# Patient Record
Sex: Female | Born: 1995 | Race: Black or African American | Hispanic: No | Marital: Single | Smoking: Never smoker
Health system: Southern US, Community
[De-identification: ages and names within clinical notes are randomized; demographics above are authoritative.]

## PROBLEM LIST (undated history)

## (undated) DIAGNOSIS — E860 Dehydration: Secondary | ICD-10-CM

## (undated) DIAGNOSIS — R1115 Cyclical vomiting syndrome unrelated to migraine: Secondary | ICD-10-CM

## (undated) DIAGNOSIS — K219 Gastro-esophageal reflux disease without esophagitis: Secondary | ICD-10-CM

## (undated) HISTORY — PX: NO PAST SURGERIES: SHX2092

---
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
## 2017-07-03 ENCOUNTER — Emergency Department (HOSPITAL_COMMUNITY): Payer: Medicaid Other

## 2017-07-03 ENCOUNTER — Inpatient Hospital Stay (HOSPITAL_COMMUNITY)
Admission: EM | Admit: 2017-07-03 | Discharge: 2017-07-05 | DRG: 208 | Disposition: A | Discharge status: Home / Self Care | Payer: Medicaid Other | Attending: General Surgery | Admitting: General Surgery

## 2017-07-03 DIAGNOSIS — J9600 Acute respiratory failure, unspecified whether with hypoxia or hypercapnia: Principal | ICD-10-CM | POA: Diagnosis present

## 2017-07-03 DIAGNOSIS — R402112 Coma scale, eyes open, never, at arrival to emergency department: Secondary | ICD-10-CM | POA: Diagnosis present

## 2017-07-03 DIAGNOSIS — F1290 Cannabis use, unspecified, uncomplicated: Secondary | ICD-10-CM | POA: Diagnosis present

## 2017-07-03 DIAGNOSIS — R402332 Coma scale, best motor response, abnormal, at arrival to emergency department: Secondary | ICD-10-CM | POA: Diagnosis present

## 2017-07-03 DIAGNOSIS — S060X0A Concussion without loss of consciousness, initial encounter: Secondary | ICD-10-CM | POA: Diagnosis present

## 2017-07-03 DIAGNOSIS — J9690 Respiratory failure, unspecified, unspecified whether with hypoxia or hypercapnia: Secondary | ICD-10-CM

## 2017-07-03 DIAGNOSIS — S81011A Laceration without foreign body, right knee, initial encounter: Secondary | ICD-10-CM | POA: Diagnosis present

## 2017-07-03 DIAGNOSIS — K219 Gastro-esophageal reflux disease without esophagitis: Secondary | ICD-10-CM | POA: Diagnosis present

## 2017-07-03 DIAGNOSIS — N179 Acute kidney failure, unspecified: Secondary | ICD-10-CM | POA: Diagnosis present

## 2017-07-03 DIAGNOSIS — R402212 Coma scale, best verbal response, none, at arrival to emergency department: Secondary | ICD-10-CM | POA: Diagnosis present

## 2017-07-03 DIAGNOSIS — S40812A Abrasion of left upper arm, initial encounter: Secondary | ICD-10-CM | POA: Diagnosis present

## 2017-07-03 DIAGNOSIS — Y92410 Unspecified street and highway as the place of occurrence of the external cause: Secondary | ICD-10-CM

## 2017-07-03 HISTORY — DX: Gastro-esophageal reflux disease without esophagitis: K21.9

## 2017-07-03 LAB — URINALYSIS, ROUTINE W REFLEX MICROSCOPIC
Bilirubin Urine: NEGATIVE
GLUCOSE, UA: NEGATIVE mg/dL
HGB URINE DIPSTICK: NEGATIVE
Ketones, ur: NEGATIVE mg/dL
Leukocytes, UA: NEGATIVE
Nitrite: NEGATIVE
PH: 5 (ref 5.0–8.0)
Protein, ur: NEGATIVE mg/dL

## 2017-07-03 LAB — I-STAT CHEM 8, ED
BUN: 7 mg/dL (ref 6–20)
Calcium, Ion: 1.13 mmol/L — ABNORMAL LOW (ref 1.15–1.40)
Chloride: 105 mmol/L (ref 101–111)
Creatinine, Ser: 1.1 mg/dL — ABNORMAL HIGH (ref 0.44–1.00)
Glucose, Bld: 117 mg/dL — ABNORMAL HIGH (ref 65–99)
HEMATOCRIT: 41 % (ref 36.0–46.0)
Hemoglobin: 13.9 g/dL (ref 12.0–15.0)
Potassium: 3.6 mmol/L (ref 3.5–5.1)
SODIUM: 140 mmol/L (ref 135–145)
TCO2: 23 mmol/L (ref 22–32)

## 2017-07-03 LAB — CBC
HEMATOCRIT: 37.4 % (ref 36.0–46.0)
Hemoglobin: 11.9 g/dL — ABNORMAL LOW (ref 12.0–15.0)
MCH: 27.2 pg (ref 26.0–34.0)
MCHC: 31.8 g/dL (ref 30.0–36.0)
MCV: 85.4 fL (ref 78.0–100.0)
Platelets: 213 10*3/uL (ref 150–400)
RBC: 4.38 MIL/uL (ref 3.87–5.11)
RDW: 15 % (ref 11.5–15.5)
WBC: 17.3 10*3/uL — ABNORMAL HIGH (ref 4.0–10.5)

## 2017-07-03 LAB — COMPREHENSIVE METABOLIC PANEL
ALBUMIN: 4.1 g/dL (ref 3.5–5.0)
ALT: 29 U/L (ref 14–54)
ANION GAP: 11 (ref 5–15)
AST: 58 U/L — AB (ref 15–41)
Alkaline Phosphatase: 69 U/L (ref 38–126)
BUN: 6 mg/dL (ref 6–20)
CO2: 22 mmol/L (ref 22–32)
Calcium: 8.9 mg/dL (ref 8.9–10.3)
Chloride: 106 mmol/L (ref 101–111)
Creatinine, Ser: 1.16 mg/dL — ABNORMAL HIGH (ref 0.44–1.00)
GFR calc Af Amer: >60 mL/min (ref >60)
GFR calc non Af Amer: >60 mL/min (ref >60)
GLUCOSE: 115 mg/dL — AB (ref 65–99)
POTASSIUM: 3.8 mmol/L (ref 3.5–5.1)
SODIUM: 139 mmol/L (ref 135–145)
Total Bilirubin: 1.2 mg/dL (ref 0.3–1.2)
Total Protein: 7 g/dL (ref 6.5–8.1)

## 2017-07-03 LAB — ABO/RH: ABO/RH(D): O POS

## 2017-07-03 LAB — PREPARE FRESH FROZEN PLASMA
UNIT DIVISION: 0
Unit division: 0

## 2017-07-03 LAB — BPAM FFP
BLOOD PRODUCT EXPIRATION DATE: 201905102359
Blood Product Expiration Date: 201905102359
ISSUE DATE / TIME: 201905091812
ISSUE DATE / TIME: 201905091812
Unit Type and Rh: 6200
Unit Type and Rh: 6200

## 2017-07-03 LAB — RAPID URINE DRUG SCREEN, HOSP PERFORMED
Amphetamines: NOT DETECTED
BARBITURATES: NOT DETECTED
BENZODIAZEPINES: POSITIVE — AB
COCAINE: NOT DETECTED
Opiates: NOT DETECTED
TETRAHYDROCANNABINOL: POSITIVE — AB

## 2017-07-03 LAB — PROTIME-INR
INR: 1.08
Prothrombin Time: 14 seconds (ref 11.4–15.2)

## 2017-07-03 LAB — ETHANOL: Alcohol, Ethyl (B): <10 mg/dL (ref <10)

## 2017-07-03 LAB — MAGNESIUM: Magnesium: 2 mg/dL (ref 1.7–2.4)

## 2017-07-03 LAB — CDS SEROLOGY

## 2017-07-03 LAB — CK: CK TOTAL: 254 U/L — AB (ref 38–234)

## 2017-07-03 LAB — PHOSPHORUS: Phosphorus: 3.6 mg/dL (ref 2.5–4.6)

## 2017-07-03 LAB — I-STAT CG4 LACTIC ACID, ED: LACTIC ACID, VENOUS: 2.96 mmol/L — AB (ref 0.5–1.9)

## 2017-07-03 LAB — TRIGLYCERIDES: Triglycerides: 89 mg/dL (ref <150)

## 2017-07-03 MED ORDER — ONDANSETRON 4 MG PO TBDP: 4.0000 mg | ORAL_TABLET | Freq: Four times a day (QID) | ORAL | Status: DC | PRN

## 2017-07-03 MED ORDER — PROPOFOL 1000 MG/100ML IV EMUL
INTRAVENOUS | Status: AC
  Filled 2017-07-03: qty 100

## 2017-07-03 MED ORDER — PROPOFOL 1000 MG/100ML IV EMUL
5.0000 ug/kg/min | INTRAVENOUS | Status: DC
  Administered 2017-07-03: 10 ug/kg/min via INTRAVENOUS

## 2017-07-03 MED ORDER — ONDANSETRON HCL 4 MG/2ML IJ SOLN
4.0000 mg | Freq: Four times a day (QID) | INTRAMUSCULAR | Status: DC | PRN
  Administered 2017-07-04: 4 mg via INTRAVENOUS
  Filled 2017-07-03: qty 2

## 2017-07-03 MED ORDER — KCL IN DEXTROSE-NACL 20-5-0.9 MEQ/L-%-% IV SOLN
INTRAVENOUS | Status: DC
  Administered 2017-07-03 – 2017-07-04 (×3): via INTRAVENOUS
  Filled 2017-07-03 (×4): qty 1000

## 2017-07-03 MED ORDER — ENOXAPARIN SODIUM 40 MG/0.4ML ~~LOC~~ SOLN
40.0000 mg | SUBCUTANEOUS | Status: DC
  Administered 2017-07-04 – 2017-07-05 (×2): 40 mg via SUBCUTANEOUS
  Filled 2017-07-03 (×2): qty 0.4

## 2017-07-03 MED ORDER — HYDROMORPHONE HCL 2 MG/ML IJ SOLN: 1.0000 mg | INTRAMUSCULAR | Status: DC | PRN

## 2017-07-03 MED ORDER — MIDAZOLAM HCL 2 MG/2ML IJ SOLN
INTRAMUSCULAR | Status: AC
  Administered 2017-07-03: 2 mg via INTRAVENOUS
  Filled 2017-07-03: qty 2

## 2017-07-03 MED ORDER — SODIUM CHLORIDE 0.9 % IV SOLN
1.0000 g | Freq: Once | INTRAVENOUS | Status: AC
  Administered 2017-07-03: 1 g via INTRAVENOUS
  Filled 2017-07-03: qty 10

## 2017-07-03 MED ORDER — OXYCODONE HCL 5 MG PO TABS: 5.0000 mg | ORAL_TABLET | ORAL | Status: DC | PRN

## 2017-07-03 MED ORDER — ACETAMINOPHEN 325 MG PO TABS
650.0000 mg | ORAL_TABLET | ORAL | Status: DC | PRN
  Administered 2017-07-05: 650 mg via ORAL
  Filled 2017-07-03: qty 2

## 2017-07-03 MED ORDER — CHLORHEXIDINE GLUCONATE 0.12% ORAL RINSE (MEDLINE KIT)
15.0000 mL | Freq: Two times a day (BID) | OROMUCOSAL | Status: DC
  Administered 2017-07-03 – 2017-07-04 (×2): 15 mL via OROMUCOSAL

## 2017-07-03 MED ORDER — FENTANYL CITRATE (PF) 100 MCG/2ML IJ SOLN: 25.0000 ug | INTRAMUSCULAR | Status: DC | PRN

## 2017-07-03 MED ORDER — NALOXONE HCL 2 MG/2ML IJ SOSY
PREFILLED_SYRINGE | INTRAMUSCULAR | Status: AC | PRN
  Administered 2017-07-03: 2 mg via INTRAVENOUS

## 2017-07-03 MED ORDER — MIDAZOLAM HCL 2 MG/2ML IJ SOLN
2.0000 mg | INTRAMUSCULAR | Status: DC | PRN
  Administered 2017-07-03: 2 mg via INTRAVENOUS

## 2017-07-03 MED ORDER — NALOXONE HCL 2 MG/2ML IJ SOSY
PREFILLED_SYRINGE | INTRAMUSCULAR | Status: AC
  Filled 2017-07-03: qty 2

## 2017-07-03 MED ORDER — LACTATED RINGERS IV BOLUS
1000.0000 mL | Freq: Once | INTRAVENOUS | Status: AC
  Administered 2017-07-03: 1000 mL via INTRAVENOUS

## 2017-07-03 MED ORDER — ORAL CARE MOUTH RINSE
15.0000 mL | OROMUCOSAL | Status: DC
  Administered 2017-07-04 (×5): 15 mL via OROMUCOSAL

## 2017-07-03 MED ORDER — MIDAZOLAM HCL 2 MG/2ML IJ SOLN
2.0000 mg | INTRAMUSCULAR | Status: AC | PRN
  Administered 2017-07-03 (×3): 2 mg via INTRAVENOUS
  Filled 2017-07-03 (×2): qty 2

## 2017-07-03 MED ORDER — FENTANYL CITRATE (PF) 100 MCG/2ML IJ SOLN: 100.0000 ug | INTRAMUSCULAR | Status: DC | PRN

## 2017-07-03 MED ORDER — ROCURONIUM BROMIDE 50 MG/5ML IV SOLN
INTRAVENOUS | Status: AC | PRN
  Administered 2017-07-03: 60 mg via INTRAVENOUS

## 2017-07-03 MED ORDER — ETOMIDATE 2 MG/ML IV SOLN
INTRAVENOUS | Status: AC | PRN
  Administered 2017-07-03: 20 mg via INTRAVENOUS

## 2017-07-03 MED ORDER — FENTANYL CITRATE (PF) 100 MCG/2ML IJ SOLN
50.0000 ug | INTRAMUSCULAR | Status: DC | PRN
  Administered 2017-07-03 – 2017-07-04 (×3): 100 ug via INTRAVENOUS
  Filled 2017-07-03 (×3): qty 2

## 2017-07-03 MED ORDER — SODIUM CHLORIDE 0.9 % IV SOLN
INTRAVENOUS | Status: AC | PRN
  Administered 2017-07-03: 1000 mL via INTRAVENOUS

## 2017-07-03 MED ORDER — PROPOFOL 1000 MG/100ML IV EMUL
5.0000 ug/kg/min | INTRAVENOUS | Status: DC
  Administered 2017-07-03: 60 ug/kg/min via INTRAVENOUS
  Administered 2017-07-04: 30 ug/kg/min via INTRAVENOUS
  Filled 2017-07-03 (×2): qty 100

## 2017-07-03 MED ORDER — IOHEXOL 300 MG/ML  SOLN
100.0000 mL | Freq: Once | INTRAMUSCULAR | Status: AC | PRN
  Administered 2017-07-03: 100 mL via INTRAVENOUS
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
## 2017-07-04 ENCOUNTER — Encounter (HOSPITAL_COMMUNITY): Payer: Self-pay | Admitting: Emergency Medicine

## 2017-07-04 ENCOUNTER — Inpatient Hospital Stay (HOSPITAL_COMMUNITY): Payer: Medicaid Other

## 2017-07-04 ENCOUNTER — Other Ambulatory Visit: Payer: Self-pay

## 2017-07-04 DIAGNOSIS — R4182 Altered mental status, unspecified: Secondary | ICD-10-CM

## 2017-07-04 LAB — COMPREHENSIVE METABOLIC PANEL
ALK PHOS: 69 U/L (ref 38–126)
ALT: 33 U/L (ref 14–54)
AST: 55 U/L — AB (ref 15–41)
Albumin: 3.5 g/dL (ref 3.5–5.0)
Anion gap: 7 (ref 5–15)
BUN: 5 mg/dL — AB (ref 6–20)
CALCIUM: 8.8 mg/dL — AB (ref 8.9–10.3)
CHLORIDE: 105 mmol/L (ref 101–111)
CO2: 26 mmol/L (ref 22–32)
CREATININE: 0.91 mg/dL (ref 0.44–1.00)
Glucose, Bld: 117 mg/dL — ABNORMAL HIGH (ref 65–99)
Potassium: 3.5 mmol/L (ref 3.5–5.1)
SODIUM: 138 mmol/L (ref 135–145)
Total Bilirubin: 1 mg/dL (ref 0.3–1.2)
Total Protein: 6.1 g/dL — ABNORMAL LOW (ref 6.5–8.1)

## 2017-07-04 LAB — TYPE AND SCREEN
ABO/RH(D): O POS
Antibody Screen: NEGATIVE
Unit division: 0
Unit division: 0

## 2017-07-04 LAB — CBC
HEMATOCRIT: 36.1 % (ref 36.0–46.0)
Hemoglobin: 11.4 g/dL — ABNORMAL LOW (ref 12.0–15.0)
MCH: 26.5 pg (ref 26.0–34.0)
MCHC: 31.6 g/dL (ref 30.0–36.0)
MCV: 84 fL (ref 78.0–100.0)
Platelets: 164 10*3/uL (ref 150–400)
RBC: 4.3 MIL/uL (ref 3.87–5.11)
RDW: 15.1 % (ref 11.5–15.5)
WBC: 15.9 10*3/uL — AB (ref 4.0–10.5)

## 2017-07-04 LAB — BPAM RBC
Blood Product Expiration Date: 201906042359
Blood Product Expiration Date: 201906052359
ISSUE DATE / TIME: 201905091811
ISSUE DATE / TIME: 201905091811
UNIT TYPE AND RH: 9500
Unit Type and Rh: 9500

## 2017-07-04 LAB — BLOOD PRODUCT ORDER (VERBAL) VERIFICATION

## 2017-07-04 LAB — HIV ANTIBODY (ROUTINE TESTING W REFLEX): HIV SCREEN 4TH GENERATION: NONREACTIVE

## 2017-07-04 LAB — MRSA PCR SCREENING: MRSA by PCR: NEGATIVE

## 2017-07-04 MED ORDER — IOHEXOL 300 MG/ML  SOLN: 100.0000 mL | Freq: Once | INTRAMUSCULAR | Status: DC | PRN

## 2017-07-04 MED ORDER — MIDAZOLAM HCL 2 MG/2ML IJ SOLN: 2.0000 mg | INTRAMUSCULAR | Status: DC | PRN

## 2017-07-04 MED ORDER — POTASSIUM CHLORIDE 10 MEQ/100ML IV SOLN
10.0000 meq | Freq: Once | INTRAVENOUS | Status: AC
  Administered 2017-07-04: 10 meq via INTRAVENOUS
  Filled 2017-07-04: qty 100
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
## 2017-07-04 NOTE — Procedures (Signed)
Extubation Procedure Note  Patient Details:   Name: Loretta Clark DOB: 10-Jan-1996 MRN: [id number]   Airway Documentation:  Airway 7.5 mm (Active)  Secured at (cm) 23 cm 07/04/2017  8:35 AM  Measured From Lips 07/04/2017  8:35 AM  Secured Location Center 07/04/2017  8:35 AM  Secured By [employer] 07/04/2017  8:35 AM  Tube Holder Repositioned Yes 07/04/2017  8:35 AM  Site Condition Dry 07/04/2017  8:35 AM   Vent end date: (not recorded) Vent end time: (not recorded)   Evaluation  O2 sats: stable throughout Complications: No apparent complications Patient did tolerate procedure well. Bilateral Breath Sounds: Clear   Yes  PT was extubates to a 4L   PT was able to speak PT is stable and sats are good  Loretta Clark, Duane Lope 07/04/2017, 12:03 PM
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
## 2017-07-04 NOTE — Plan of Care (Signed)
Patient doing well overnight. Very purposeful with all extremities. Did follow commands and open eyes when asked to do so.
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
## 2017-07-04 NOTE — Progress Notes (Signed)
MD paged about evaluating whether collar can be removed. Will continue to monitor.

## 2017-07-04 NOTE — Progress Notes (Signed)
Patient vomited. Zofran given and MD notified. Family requesting to see MD to evaluate if collar can be removed. Ripley Fraise D

## 2017-07-05 LAB — BASIC METABOLIC PANEL
Anion gap: 6 (ref 5–15)
BUN: <5 mg/dL — ABNORMAL LOW (ref 6–20)
CALCIUM: 8.7 mg/dL — AB (ref 8.9–10.3)
CO2: 25 mmol/L (ref 22–32)
CREATININE: 0.83 mg/dL (ref 0.44–1.00)
Chloride: 111 mmol/L (ref 101–111)
Glucose, Bld: 112 mg/dL — ABNORMAL HIGH (ref 65–99)
Potassium: 3.8 mmol/L (ref 3.5–5.1)
Sodium: 142 mmol/L (ref 135–145)

## 2017-07-05 LAB — CBC
HCT: 33.9 % — ABNORMAL LOW (ref 36.0–46.0)
Hemoglobin: 10.6 g/dL — ABNORMAL LOW (ref 12.0–15.0)
MCH: 26.5 pg (ref 26.0–34.0)
MCHC: 31.3 g/dL (ref 30.0–36.0)
MCV: 84.8 fL (ref 78.0–100.0)
PLATELETS: 155 10*3/uL (ref 150–400)
RBC: 4 MIL/uL (ref 3.87–5.11)
RDW: 15.1 % (ref 11.5–15.5)
WBC: 12.5 10*3/uL — ABNORMAL HIGH (ref 4.0–10.5)

## 2017-07-05 MED ORDER — OXYCODONE HCL 5 MG PO TABS: 5.0000 mg | ORAL_TABLET | ORAL | 0 refills | Status: DC | PRN

## 2017-07-05 NOTE — Discharge Instructions (Signed)
Concussion, Adult  A concussion is a brain injury from a direct hit (blow) to the head or body. This injury causes the brain to shake quickly back and forth inside the skull. It is caused by:   A hit to the head.   A quick and sudden movement (jolt) of the head or neck.    How fast you will get better from a concussion depends on many things like how bad your concussion was, what part of your brain was hurt, how old you are, and how healthy you were before the concussion. Recovery can take time. It is important to wait to return to activity until a doctor says it is safe and your symptoms are all gone.  Follow these instructions at home:  Activity   Limit activities that need a lot of thought or concentration. These include:  ? Homework or work for your job.  ? Watching TV.  ? Computer work.  ? Playing memory games and puzzles.   Rest. Rest helps the brain to heal. Make sure you:  ? Get plenty of sleep at night. Do not stay up late.  ? Go to bed at the same time every day.  ? Rest during the day. Take naps or rest breaks when you feel tired.   It can be dangerous if you get another concussion before the first one has healed Do not do activities that could cause a second concussion, such as riding a bike or playing sports.   Ask your doctor when you can return to your normal activities, like driving, riding a bike, or using machinery. Your ability to react may be slower. Do not do these activities if you are dizzy. Your doctor will likely give you a plan for slowly going back to activities.  General instructions   Take over-the-counter and prescription medicines only as told by your doctor.   Do not drink alcohol until your doctor says you can.   If it is harder than usual to remember things, write them down.   If you are easily distracted, try to do one thing at a time. For example, do not try to watch TV while making dinner.   Talk with family members or close friends when you need to make important  decisions.   Watch your symptoms and tell other people to do the same. Other problems (complications) can happen after a concussion. Older adults with a brain injury may have a higher risk of serious problems, such as a blood clot in the brain.   Tell your teachers, school nurse, school counselor, coach, athletic trainer, or work manager about your injury and symptoms. Tell them about what you can or cannot do. They should watch for:  ? More problems with attention or concentration.  ? More trouble remembering or learning new information.  ? More time needed to do tasks or assignments.  ? Being more annoyed (irritable) or having a harder time dealing with stress.  ? Any other symptoms that get worse.   Keep all follow-up visits as told by your health care provider. This is important.  Prevention   It is very important that you donot get another brain injury, especially before you have healed. In rare cases, another injury can cause permanent brain damage, brain swelling, or death. You have the most risk if you get another head injury in the first 7-10 days after you were hurt before. To avoid injuries:  ? Wear a seat belt when you ride in   a car.  ? Do not drink too much alcohol.  ? Avoid activities that could make you get a second concussion, like contact sports.  ? Wear a helmet when you do activities like:   Biking.   Skiing.   Skateboarding.   Skating.  ? Make your home safe by:   Removing things from the floor or stairs that could make you trip.   Using grab bars in bathrooms and handrails by stairs.   Placing non-slip mats on floors and in bathtubs.   Putting more light in dark areas.  Contact a doctor if:   Your symptoms get worse.   You have new symptoms.   You keep having symptoms for more than 2 weeks.  Get help right away if:   You have bad headaches, or your headaches get worse.   You have weakness in any part of your body.   You have loss of feeling (numbness).   You feel off  balance.   You keep throwing up (vomiting).   You feel more sleepy.   The black center of one eye (pupil) is bigger than the other one.   You twitch or shake violently (convulse) or have a seizure.   Your speech is not clear (is slurred).   You feel more tired, more confused, or more annoyed.   You do not recognize people or places.   You have neck pain.   It is hard to wake you up.   You have strange behavior changes.   You pass out (lose consciousness).  Summary   A concussion is a brain injury from a direct hit (blow) to the head or body.   This condition is treated with rest and careful watching of symptoms.   If you keep having symptoms for more than 2 weeks, call your doctor.  This information is not intended to replace advice given to you by your health care provider. Make sure you discuss any questions you have with your health care provider.  Document Released: 01/30/2009 Document Revised: 01/27/2016 Document Reviewed: 01/27/2016  Elsevier Interactive Patient Education  2017 [employer]
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
## 2017-07-09 ENCOUNTER — Encounter (HOSPITAL_COMMUNITY): Payer: Self-pay | Admitting: General Practice

## 2021-07-12 ENCOUNTER — Emergency Department (HOSPITAL_BASED_OUTPATIENT_CLINIC_OR_DEPARTMENT_OTHER): Payer: Managed Care, Other (non HMO)

## 2021-07-12 ENCOUNTER — Encounter (HOSPITAL_BASED_OUTPATIENT_CLINIC_OR_DEPARTMENT_OTHER): Payer: Self-pay

## 2021-07-12 ENCOUNTER — Other Ambulatory Visit: Payer: Self-pay

## 2021-07-12 ENCOUNTER — Emergency Department (HOSPITAL_BASED_OUTPATIENT_CLINIC_OR_DEPARTMENT_OTHER)
Admission: EM | Admit: 2021-07-12 | Discharge: 2021-07-12 | Disposition: A | Discharge status: Home / Self Care | Payer: Managed Care, Other (non HMO) | Attending: Emergency Medicine | Admitting: Emergency Medicine

## 2021-07-12 DIAGNOSIS — S9031XA Contusion of right foot, initial encounter: Secondary | ICD-10-CM | POA: Diagnosis not present

## 2021-07-12 DIAGNOSIS — W19XXXA Unspecified fall, initial encounter: Secondary | ICD-10-CM

## 2021-07-12 DIAGNOSIS — Y9361 Activity, american tackle football: Secondary | ICD-10-CM | POA: Insufficient documentation

## 2021-07-12 DIAGNOSIS — M79604 Pain in right leg: Secondary | ICD-10-CM | POA: Diagnosis not present

## 2021-07-12 DIAGNOSIS — W1839XA Other fall on same level, initial encounter: Secondary | ICD-10-CM | POA: Diagnosis not present

## 2021-07-12 DIAGNOSIS — S99921A Unspecified injury of right foot, initial encounter: Secondary | ICD-10-CM | POA: Diagnosis present

## 2021-07-12 DIAGNOSIS — S80211A Abrasion, right knee, initial encounter: Secondary | ICD-10-CM | POA: Diagnosis not present
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
## 2021-07-12 NOTE — ED Triage Notes (Signed)
Pt reports right ankle pain and swelling after injuring it when playing football on Saturday as well as scab on right knee. She states she went to Urgent Care today and had xrays, no fractures. She states she came here to get answers as to why it is swollen.

## 2022-12-04 IMAGING — US US EXTREM LOW VENOUS*R*
1 series · 14 of 24 positions shown · non-contrast
Comparison: None Available.

CLINICAL DATA: Right lower extremity pain

EXAM:
RIGHT LOWER EXTREMITY VENOUS DOPPLER ULTRASOUND
TECHNIQUE: Gray-scale sonography with compression, as well as color and duplex
ultrasound, were performed to evaluate the deep venous system(s)
from the level of the common femoral vein through the popliteal and
proximal calf veins.

[Series 1: us extrem low venous*right* · 14 of 32 slices shown]
[im 1/32]
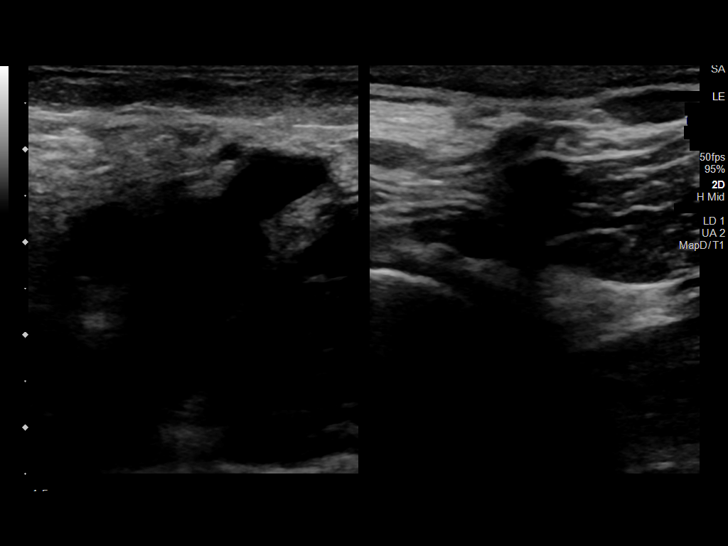
[im 3/32]
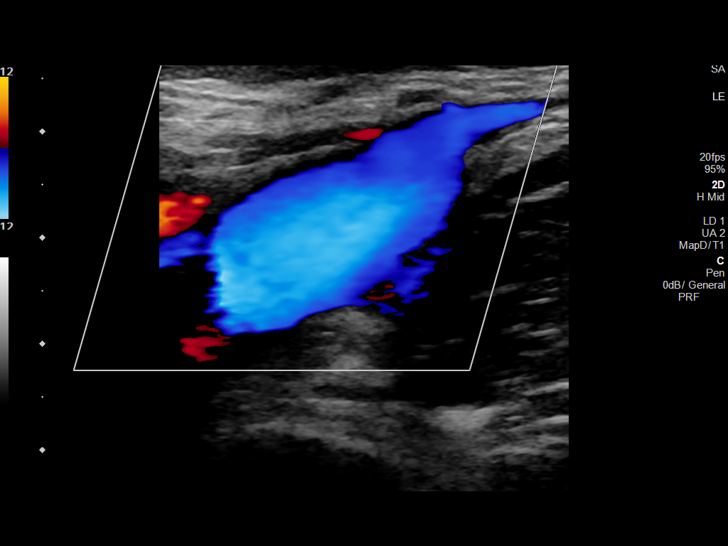
[im 6/32]
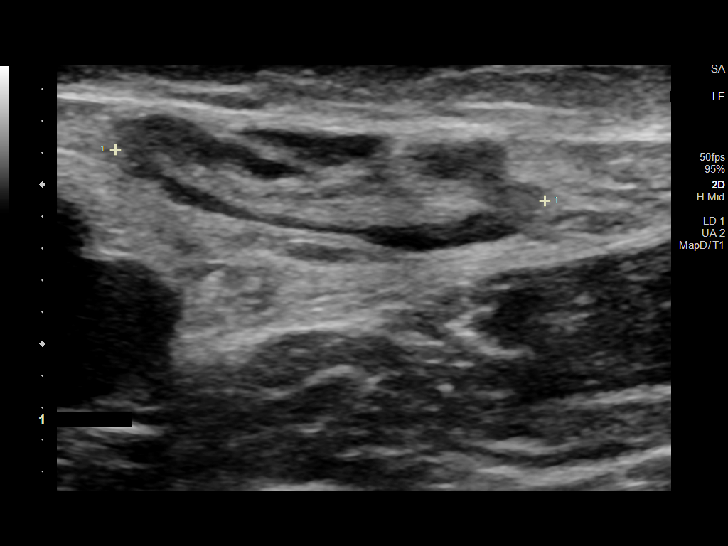
[im 9/32]
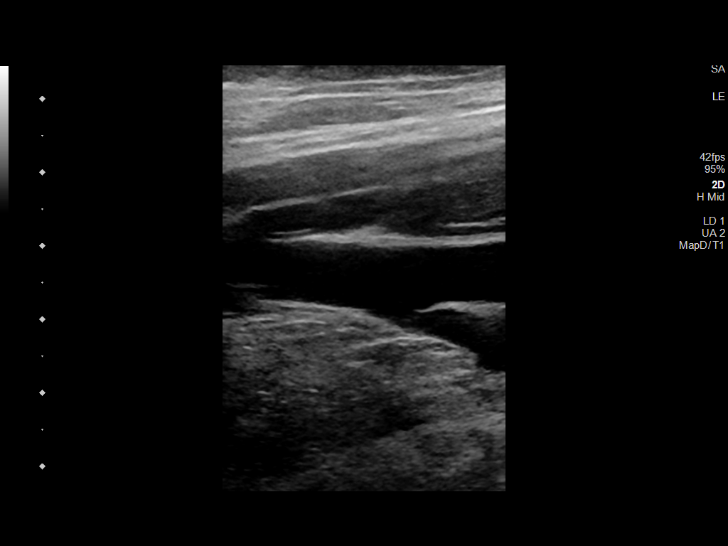
[im 10/32]
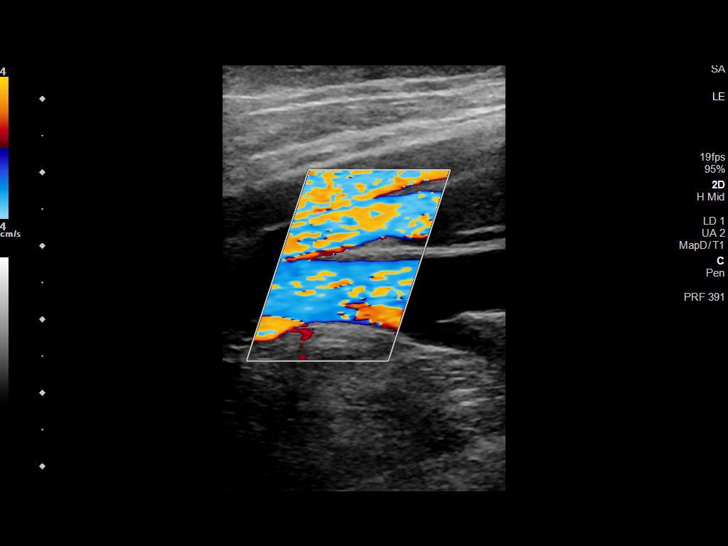
[im 13/32]
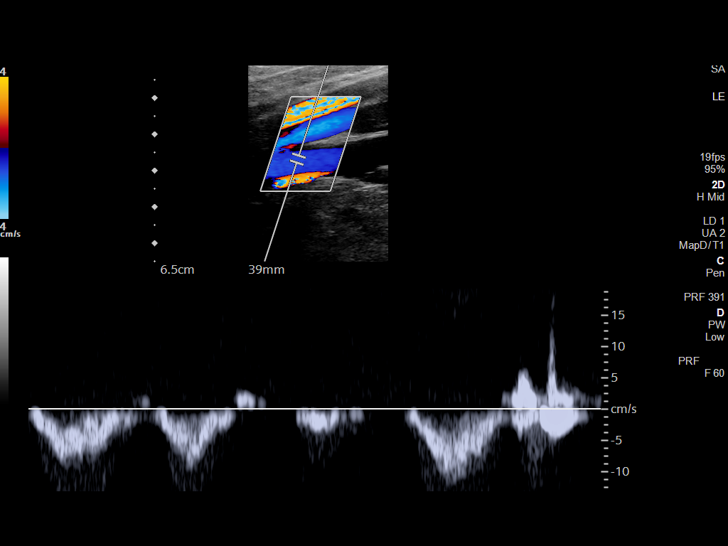
[im 15/32]
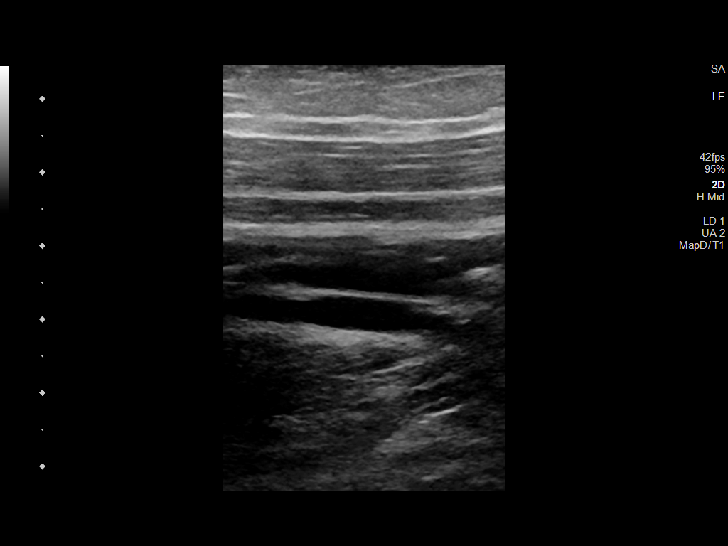
[im 17/32]
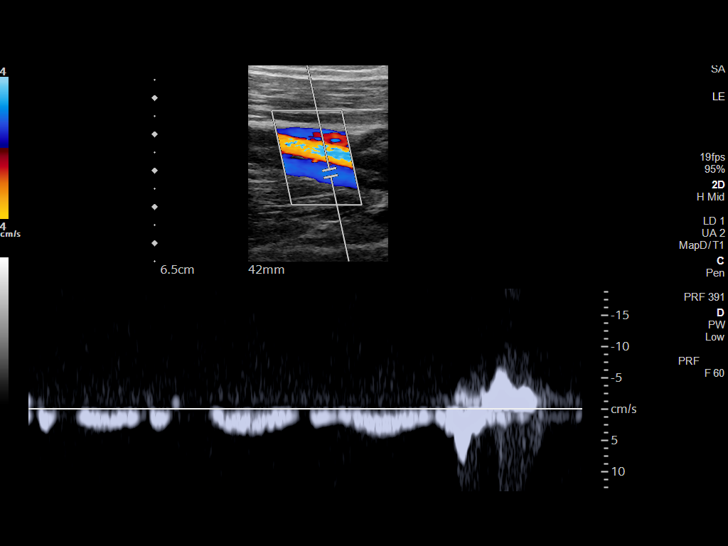
[im 19/32]
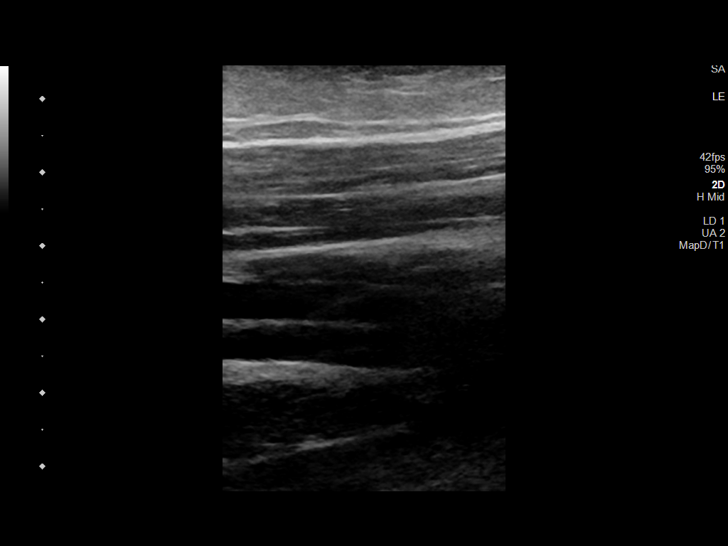
[im 22/32]
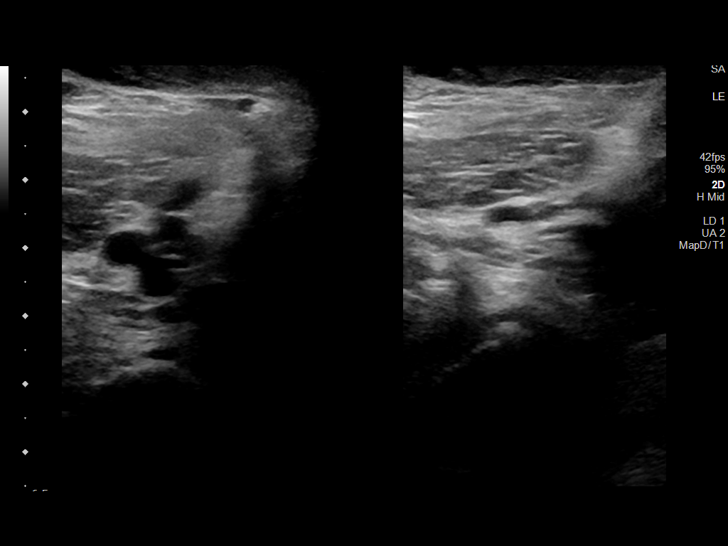
[im 25/32]
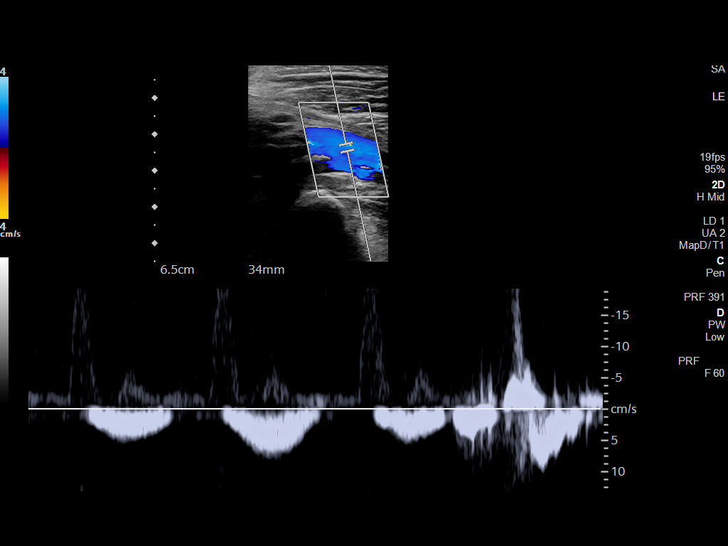
[im 26/32]
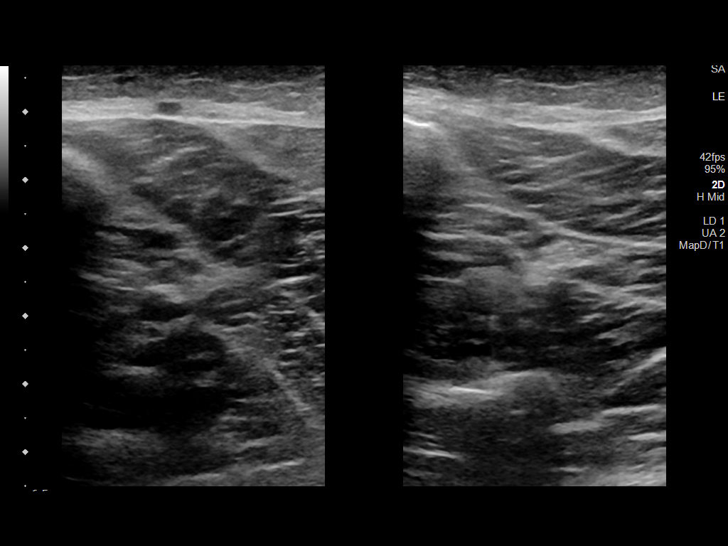
[im 29/32]
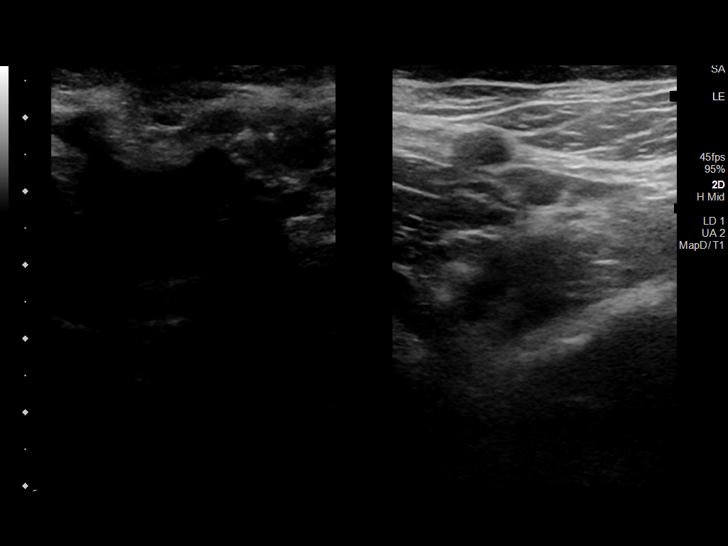
[im 32/32]
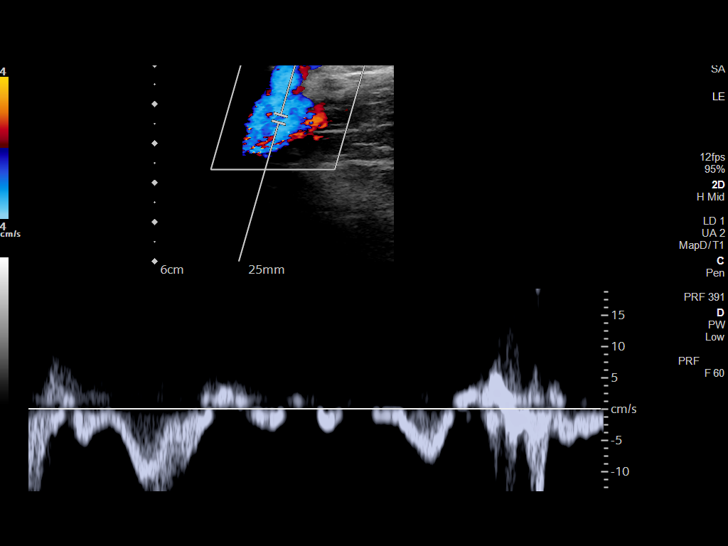

[14 of 24 positions shown; findings below may reference images not displayed]

FINDINGS: VENOUS

Normal compressibility of the common femoral, superficial femoral,
and popliteal veins, as well as the visualized calf veins.
Visualized portions of profunda femoral vein and great saphenous
vein unremarkable. No filling defects to suggest DVT on grayscale or
color Doppler imaging. Doppler waveforms show normal direction of
venous flow, normal respiratory plasticity and response to
augmentation.

Limited views of the contralateral common femoral vein are
unremarkable.

OTHER

None.

Limitations: none
IMPRESSION: 1. No evidence of deep venous thrombosis within the right lower
extremity.

## 2022-12-04 IMAGING — DX DG TIBIA/FIBULA 2V*R*
4 series · 4 of 4 positions shown · non-contrast
Comparison: None Available.

CLINICAL DATA: Right lower leg pain and swelling

EXAM:
RIGHT TIBIA AND FIBULA - 2 VIEW

[tibia ap (1 of 2)]
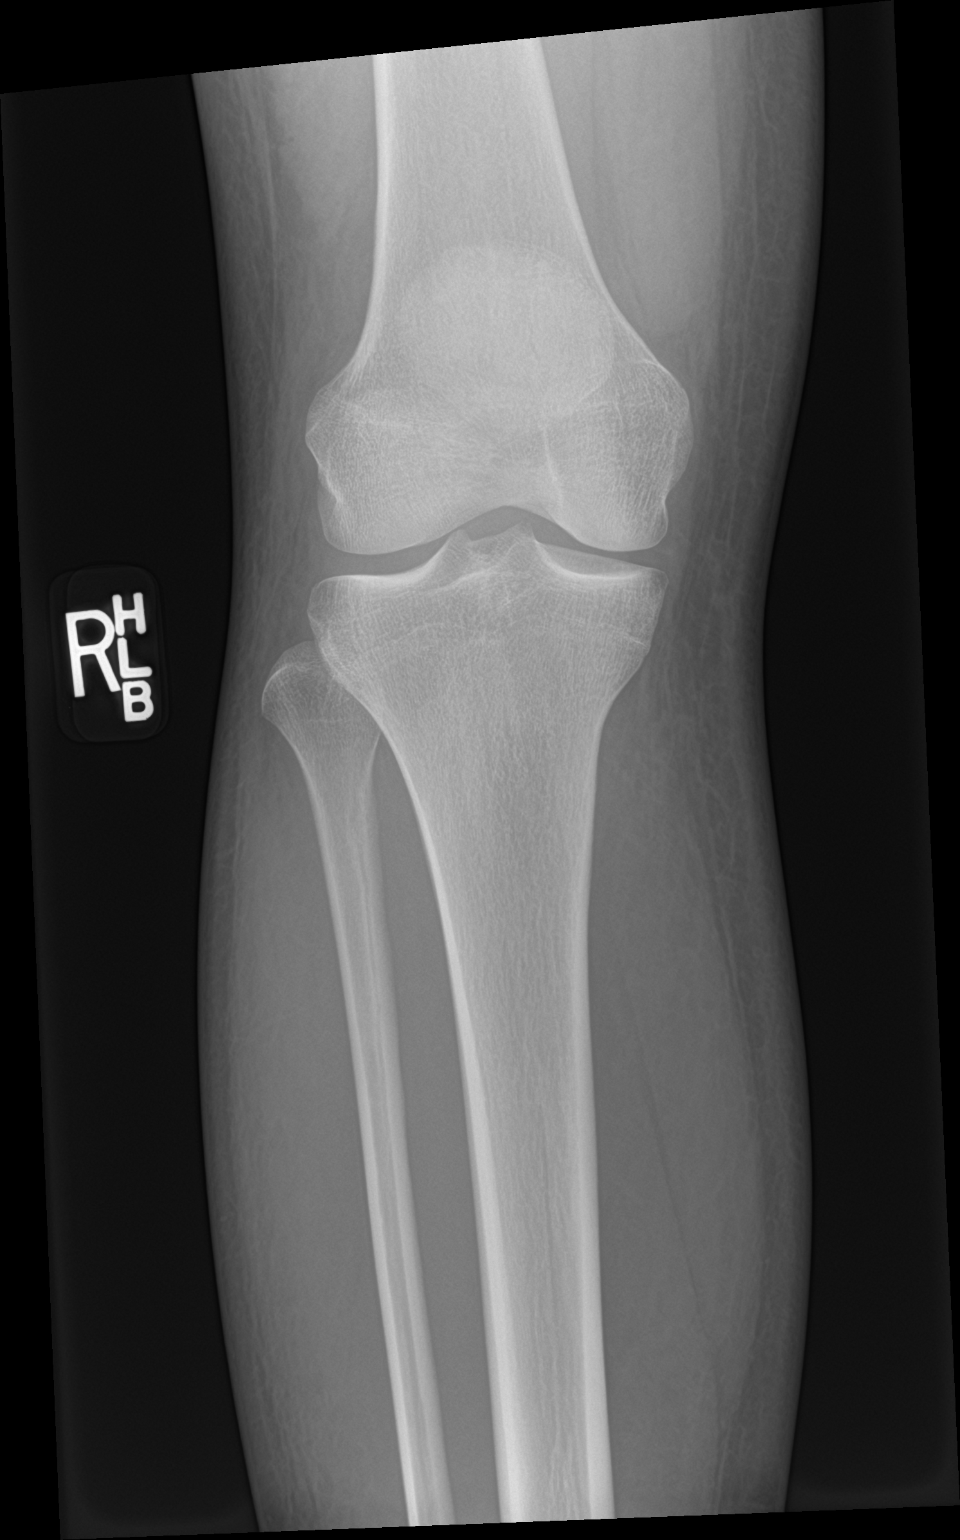

[tibia ap (2 of 2)]
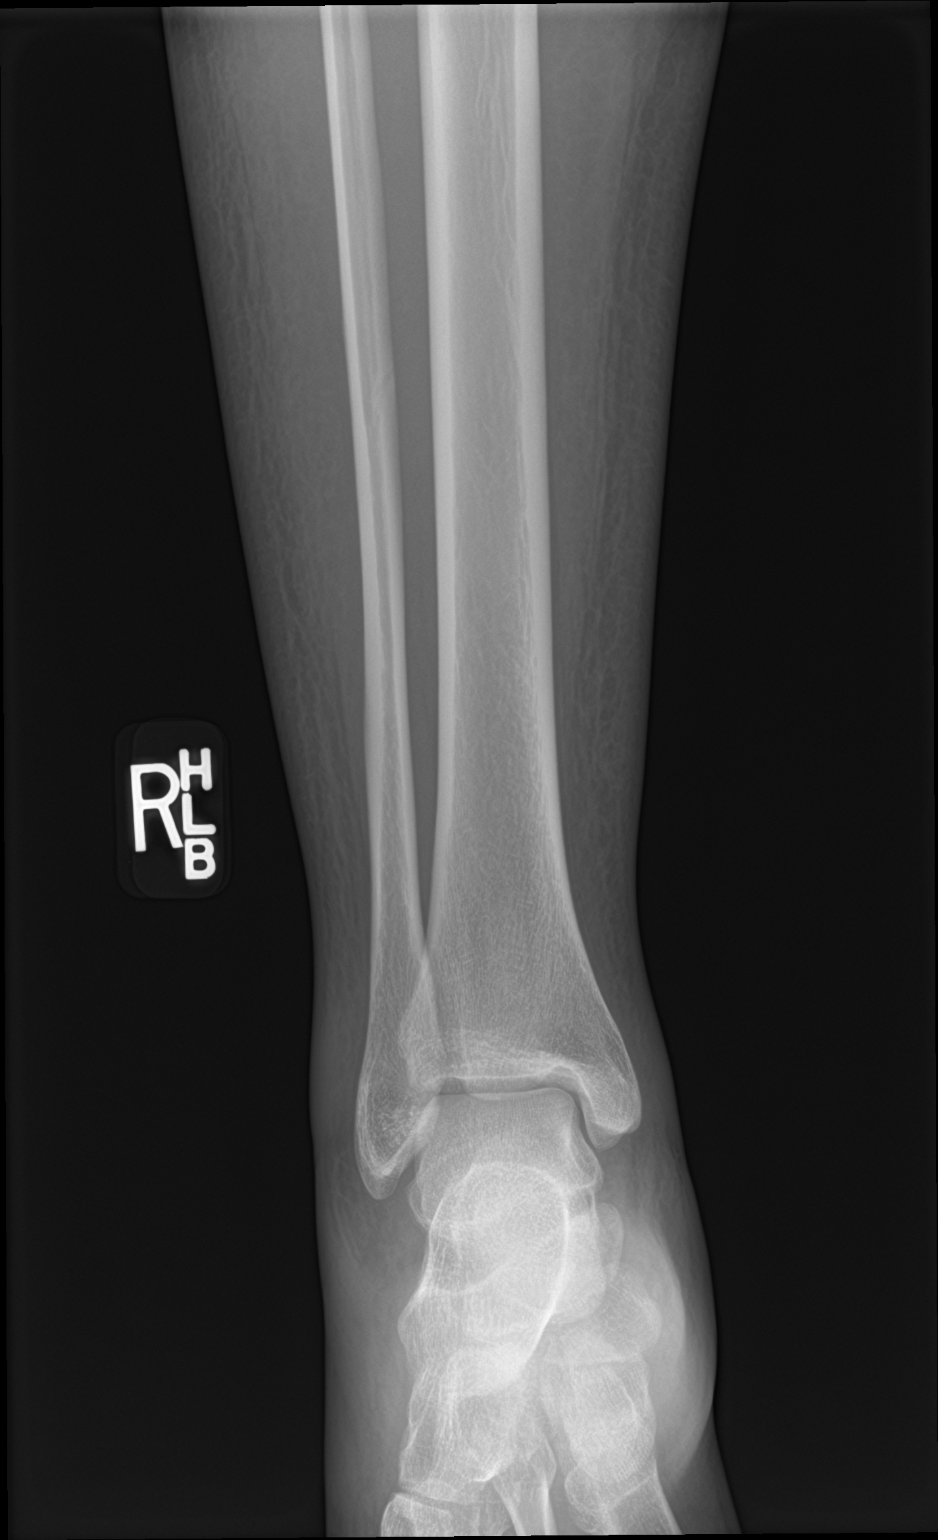

[tibia lat (1 of 2)]
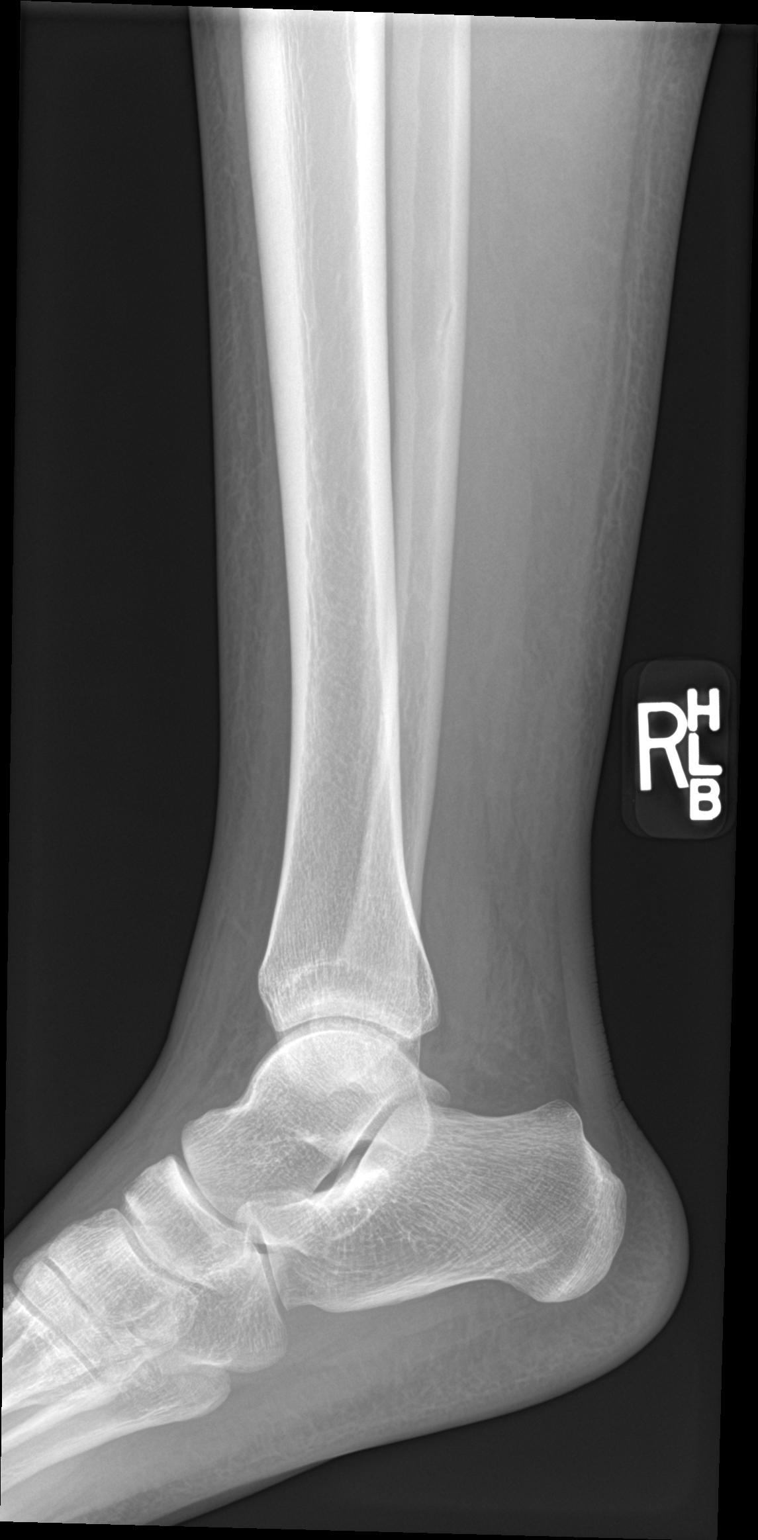

[tibia lat (2 of 2)]
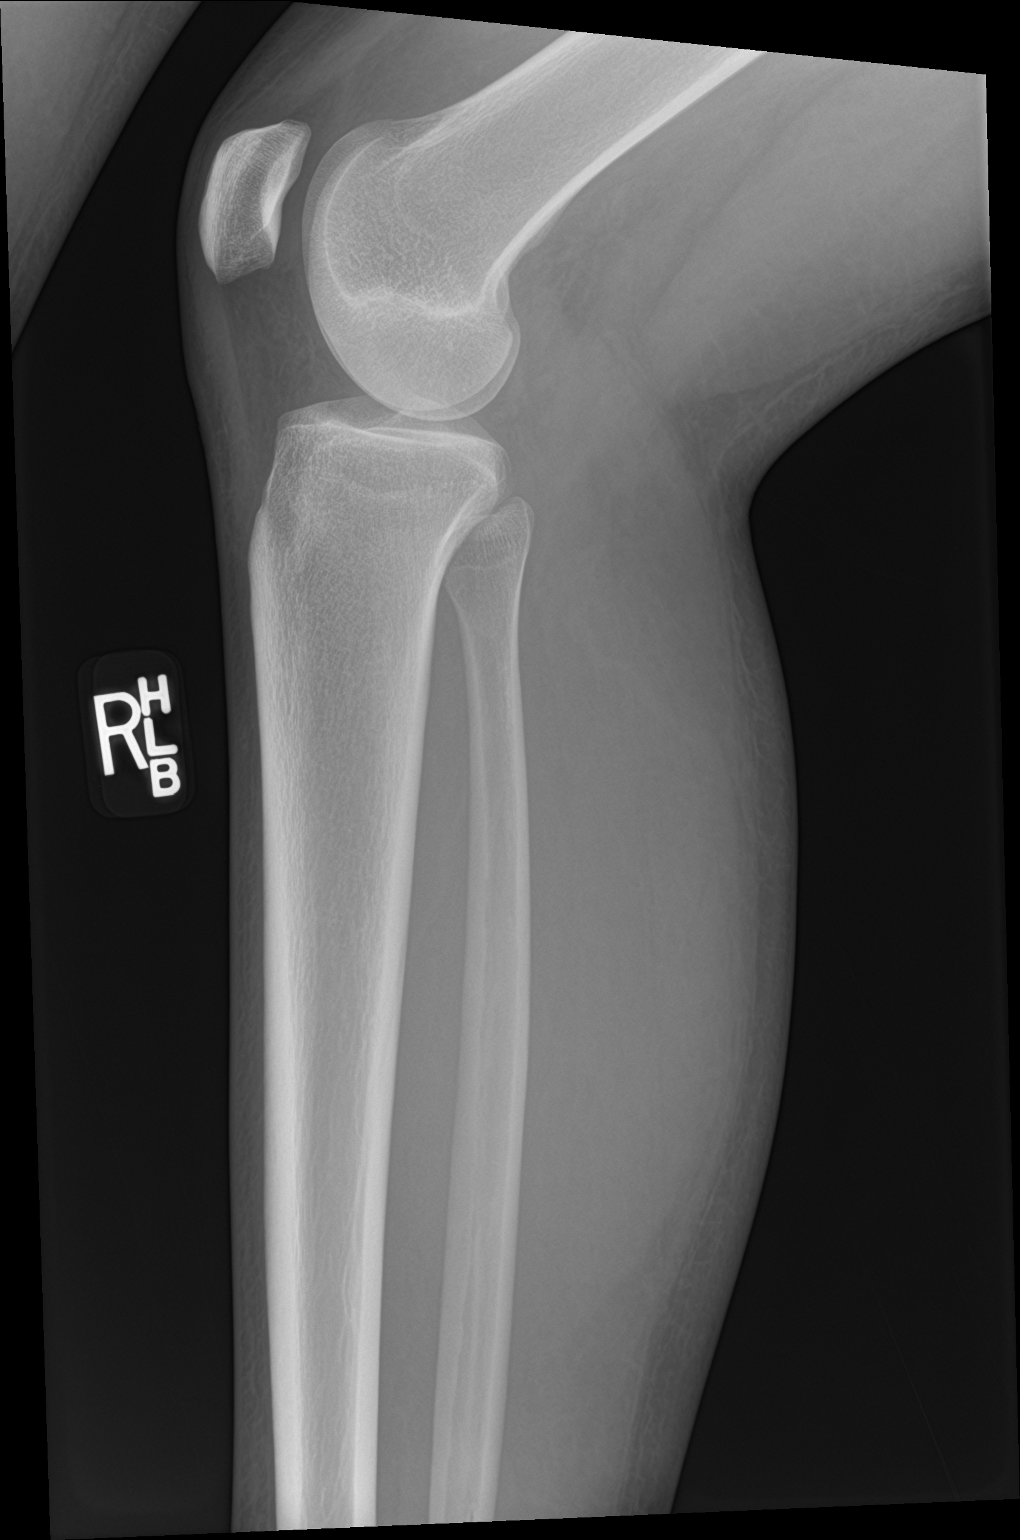

[4 of 4 positions shown; findings below may reference images not displayed]

FINDINGS: No acute fracture or dislocation.The joint spaces are
preserved.Alignment is unremarkable.No significant soft tissue
abnormality or foreign body.
IMPRESSION: No acute osseous abnormality.

## 2023-04-22 ENCOUNTER — Other Ambulatory Visit: Payer: Self-pay

## 2023-04-22 ENCOUNTER — Emergency Department (HOSPITAL_BASED_OUTPATIENT_CLINIC_OR_DEPARTMENT_OTHER)
Admission: EM | Admit: 2023-04-22 | Discharge: 2023-04-22 | Disposition: A | Discharge status: Home / Self Care | Payer: Medicaid Other | Attending: Emergency Medicine | Admitting: Emergency Medicine

## 2023-04-22 ENCOUNTER — Encounter (HOSPITAL_BASED_OUTPATIENT_CLINIC_OR_DEPARTMENT_OTHER): Payer: Self-pay | Admitting: Emergency Medicine

## 2023-04-22 DIAGNOSIS — R809 Proteinuria, unspecified: Secondary | ICD-10-CM | POA: Insufficient documentation

## 2023-04-22 DIAGNOSIS — R1084 Generalized abdominal pain: Secondary | ICD-10-CM | POA: Diagnosis not present

## 2023-04-22 DIAGNOSIS — R112 Nausea with vomiting, unspecified: Secondary | ICD-10-CM | POA: Diagnosis present

## 2023-04-22 LAB — URINALYSIS, ROUTINE W REFLEX MICROSCOPIC
Glucose, UA: NEGATIVE mg/dL
Ketones, ur: 15 mg/dL — AB
Leukocytes,Ua: NEGATIVE
Nitrite: NEGATIVE
Protein, ur: >=300 mg/dL — AB
Specific Gravity, Urine: 1.025 (ref 1.005–1.030)
pH: 7 (ref 5.0–8.0)

## 2023-04-22 LAB — RESP PANEL BY RT-PCR (RSV, FLU A&B, COVID)  RVPGX2
Influenza A by PCR: NEGATIVE
Influenza B by PCR: NEGATIVE
Resp Syncytial Virus by PCR: NEGATIVE
SARS Coronavirus 2 by RT PCR: NEGATIVE

## 2023-04-22 LAB — URINALYSIS, MICROSCOPIC (REFLEX)

## 2023-04-22 LAB — COMPREHENSIVE METABOLIC PANEL
ALT: 24 U/L (ref 0–44)
AST: 24 U/L (ref 15–41)
Albumin: 4.2 g/dL (ref 3.5–5.0)
Alkaline Phosphatase: 66 U/L (ref 38–126)
Anion gap: 10 (ref 5–15)
BUN: 12 mg/dL (ref 6–20)
CO2: 25 mmol/L (ref 22–32)
Calcium: 9.2 mg/dL (ref 8.9–10.3)
Chloride: 100 mmol/L (ref 98–111)
Creatinine, Ser: 0.83 mg/dL (ref 0.44–1.00)
GFR, Estimated: >60 mL/min (ref >60)
Glucose, Bld: 120 mg/dL — ABNORMAL HIGH (ref 70–99)
Potassium: 3.7 mmol/L (ref 3.5–5.1)
Sodium: 135 mmol/L (ref 135–145)
Total Bilirubin: 1.3 mg/dL — ABNORMAL HIGH (ref 0.0–1.2)
Total Protein: 7.5 g/dL (ref 6.5–8.1)

## 2023-04-22 LAB — CBC
HCT: 38.8 % (ref 36.0–46.0)
Hemoglobin: 12.5 g/dL (ref 12.0–15.0)
MCH: 28.7 pg (ref 26.0–34.0)
MCHC: 32.2 g/dL (ref 30.0–36.0)
MCV: 89.2 fL (ref 80.0–100.0)
Platelets: 250 10*3/uL (ref 150–400)
RBC: 4.35 MIL/uL (ref 3.87–5.11)
RDW: 13.5 % (ref 11.5–15.5)
WBC: 14.5 10*3/uL — ABNORMAL HIGH (ref 4.0–10.5)
nRBC: 0 % (ref 0.0–0.2)

## 2023-04-22 LAB — HCG, QUANTITATIVE, PREGNANCY: hCG, Beta Chain, Quant, S: 1 m[IU]/mL (ref <5)

## 2023-04-22 LAB — LIPASE, BLOOD: Lipase: 25 U/L (ref 11–51)

## 2023-04-22 MED ORDER — SODIUM CHLORIDE 0.9 % IV BOLUS
1000.0000 mL | Freq: Once | INTRAVENOUS | Status: AC
Start: 2023-04-22 — End: 2023-04-22
  Administered 2023-04-22: 1000 mL via INTRAVENOUS

## 2023-04-22 MED ORDER — DROPERIDOL 2.5 MG/ML IJ SOLN
1.2500 mg | Freq: Once | INTRAMUSCULAR | Status: AC
  Administered 2023-04-22: 1.25 mg via INTRAVENOUS
  Filled 2023-04-22: qty 2

## 2023-04-22 MED ORDER — PROMETHAZINE HCL 25 MG PO TABS: 25.0000 mg | ORAL_TABLET | Freq: Four times a day (QID) | ORAL | 0 refills | Status: AC | PRN

## 2023-04-22 MED ORDER — METOCLOPRAMIDE HCL 5 MG/ML IJ SOLN
10.0000 mg | Freq: Once | INTRAMUSCULAR | Status: AC
  Administered 2023-04-22: 10 mg via INTRAVENOUS
  Filled 2023-04-22: qty 2

## 2023-04-22 MED ORDER — DIPHENHYDRAMINE HCL 50 MG/ML IJ SOLN
12.5000 mg | Freq: Once | INTRAMUSCULAR | Status: AC
Start: 2023-04-22 — End: 2023-04-22
  Administered 2023-04-22: 12.5 mg via INTRAVENOUS
  Filled 2023-04-22: qty 1

## 2023-04-22 MED ORDER — KETOROLAC TROMETHAMINE 30 MG/ML IJ SOLN
30.0000 mg | Freq: Once | INTRAMUSCULAR | Status: AC
  Administered 2023-04-22: 30 mg via INTRAVENOUS
  Filled 2023-04-22: qty 1

## 2023-04-22 NOTE — ED Triage Notes (Addendum)
 Emesis x 3 days , denies abd pain or diarrhea , no URI symptoms .  Hx cycling vomiting

## 2023-04-22 NOTE — ED Provider Notes (Signed)
  EMERGENCY DEPARTMENT AT [facility] Provider Note   CSN: [id number] Arrival date & time: 04/22/23  1353     History  Chief Complaint  Patient presents with   Emesis    Loretta Clark is a 28 y.o. female with hx of intractable vomiting, cyclical vomiting, GERD, who presents to the ED complaining of vomiting for the past 2-3 days. Denies any diarrhea, nasal congestion, or cough.  Complaining of generalized body aches and abdominal pain. Reports she started her menstrual cycle 3 days ago. Has tried drinking gatorade and water but threw those up today. Mother reported to nursing patient intermittently has symptoms like this but usually not to this extent. Pt has not had any food today.    Emesis Associated symptoms: abdominal pain        Home Medications Prior to Admission medications   Medication Sig Start Date End Date Taking? Authorizing Provider  promethazine (PHENERGAN) 25 MG tablet Take 1 tablet (25 mg total) by mouth every 6 (six) hours as needed for nausea or vomiting. 04/22/23  Yes Abdirahim Flavell T, PA-C      Allergies    Doxycycline    Review of Systems   Review of Systems  Gastrointestinal:  Positive for abdominal pain, nausea and vomiting.  All other systems reviewed and are negative.   Physical Exam Updated Vital Signs BP (!) 140/85 (BP Location: Left Arm)   Pulse (!) 49   Temp 99.4 F (37.4 C) (Oral)   Resp 12   Wt 59 kg   LMP 04/19/2023   SpO2 100%   BMI 19.20 kg/m  Physical Exam Vitals and nursing note reviewed.  Constitutional:      Appearance: Normal appearance.  HENT:     Head: Normocephalic and atraumatic.  Eyes:     Conjunctiva/sclera: Conjunctivae normal.  Cardiovascular:     Rate and Rhythm: Normal rate and regular rhythm.  Pulmonary:     Effort: Pulmonary effort is normal. No respiratory distress.     Breath sounds: Normal breath sounds.  Abdominal:     General: There is no distension.      Palpations: Abdomen is soft.     Tenderness: There is generalized abdominal tenderness. There is no guarding or rebound.  Skin:    General: Skin is warm and dry.  Neurological:     General: No focal deficit present.     Mental Status: She is alert.     ED Results / Procedures / Treatments   Labs (all labs ordered are listed, but only abnormal results are displayed) Labs Reviewed  COMPREHENSIVE METABOLIC PANEL - Abnormal; Notable for the following components:      Result Value   Glucose, Bld 120 (*)    Total Bilirubin 1.3 (*)    All other components within normal limits  CBC - Abnormal; Notable for the following components:   WBC 14.5 (*)    All other components within normal limits  URINALYSIS, ROUTINE W REFLEX MICROSCOPIC - Abnormal; Notable for the following components:   Hgb urine dipstick MODERATE (*)    Bilirubin Urine SMALL (*)    Ketones, ur 15 (*)    Protein, ur >=300 (*)    All other components within normal limits  URINALYSIS, MICROSCOPIC (REFLEX) - Abnormal; Notable for the following components:   Bacteria, UA FEW (*)    All other components within normal limits  RESP PANEL BY RT-PCR (RSV, FLU A&B, COVID)  RVPGX2  LIPASE, BLOOD  HCG,  QUANTITATIVE, PREGNANCY    EKG None  Radiology No results found.  Procedures Procedures    Medications Ordered in ED Medications  sodium chloride 0.9 % bolus 1,000 mL (0 mLs Intravenous Stopped 04/22/23 1835)  metoCLOPramide (REGLAN) injection 10 mg (10 mg Intravenous Given 04/22/23 1618)  diphenhydrAMINE (BENADRYL) injection 12.5 mg (12.5 mg Intravenous Given 04/22/23 1623)  ketorolac (TORADOL) 30 MG/ML injection 30 mg (30 mg Intravenous Given 04/22/23 1628)  droperidol (INAPSINE) 2.5 MG/ML injection 1.25 mg (1.25 mg Intravenous Given 04/22/23 1848)    ED Course/ Medical Decision Making/ A&P                                 Medical Decision Making Amount and/or Complexity of Data Reviewed Labs:  ordered.  Risk Prescription drug management.   This patient is a 28 y.o. female  who presents to the ED for concern of nausea and vomiting x 3 days.   Differential diagnoses prior to evaluation: The emergent differential diagnosis includes, but is not limited to,  ACS/MI, Boerhaave's, DKA, elevated ICP, Ischemic bowel, Sepsis, drug-related, Appendicitis, Bowel obstruction, Electrolyte abnormalities, Pancreatitis, Biliary colic, Gastroenteritis, Gastroparesis, Hepatitis, Migraine, Renal colic, PUD, UTI, Ovarian torsion, Pregnancy, Hyperemesis gravidarum. This is not an exhaustive differential.   Past Medical History / Co-morbidities / Social History: intractable vomiting, cyclical vomiting, GERD  Additional history: Chart reviewed. Pertinent results include: No recent visits for same in [facility]  Physical Exam: Physical exam performed. The pertinent findings include: Normal vitals, no acute distress. Abdomen soft with generalized tenderness, no rebound or guarding.   Lab Tests/Imaging studies: I personally interpreted labs/imaging and the pertinent results include:  WBC 14.5, CMP unremarkable. Normal lipase. Respiratory panel negative. Pregnancy negative. UA with moderate hemoglobin, small bilirubin, 15 ketones and > 300 protein, suspect starvation ketosis due to dehydration.   Medications: I ordered medication including IVF, reglan, benadryl, toradol, droperidol.  I have reviewed the patients home medicines and have made adjustments as needed. On reevaluation, pain and vomiting has improved. Patient passed PO challenge.    Disposition: After consideration of the diagnostic results and the patients response to treatment, I feel that emergency department workup does not suggest an emergent condition requiring admission or immediate intervention beyond what has been performed at this time. The plan is: discharge to home. Suspect dehydration as result from GI loss, likely cyclical  vomiting (hx of same) or enteritis. Will send with antiemetics for home. Will recommend following up with PCP for lab recheck given level of proteinuria, but acutely I suspect in the case of dehydration. The patient is safe for discharge and has been instructed to return immediately for worsening symptoms, change in symptoms or any other concerns.  Final Clinical Impression(s) / ED Diagnoses Final diagnoses:  Nausea and vomiting, unspecified vomiting type  Proteinuria, unspecified type    Rx / DC Orders ED Discharge Orders          Ordered    promethazine (PHENERGAN) 25 MG tablet  Every 6 hours PRN        04/22/23 2045           Portions of this report may have been transcribed using voice recognition software. Every effort was made to ensure accuracy; however, inadvertent computerized transcription errors may be present.    Jeanella Flattery 04/22/23 2133    Terrilee Files, MD 04/23/23 1014

## 2023-04-22 NOTE — ED Notes (Signed)
 Pt. Has had multiple episodes of vomiting today with abd. Pain.  Pt. Reports she has had abd. Pain since her period started on Sunday.  Pt. Has tried to drink gatoraid and water but vomited and has had no food without vomiting today.    Pt. Mother reports the Pt. Usually has these issues at her home  but not to this extent.

## 2023-04-22 NOTE — Discharge Instructions (Addendum)
 You were seen in the emergency department today for nausea and vomiting.  As we discussed, your lab work was reassuring. Your labs/urine appeared you are dehydrated, and I would recommend following up with a primary care doctor within the next 1-2 weeks for lab recheck.   Please follow up with your primary care provider regarding your visit today. If you do not have a primary care provider, you may reach out to [facility] at [phone] to establish with one and make your first appointment.  We treat ongoing vomiting with controlling your symptoms and good hydration. I have sent a prescription of nausea medication to your pharmacy.   I recommend trying to drink plenty of water and starting with eating bland foods, all as tolerated. You can slowly work your way up to more substantial food after that.  Continue to monitor how you're doing and return to the ER for new or worsening symptoms such as fever, worsening pain, inability to keep anything down, etc.
